# Patient Record
Sex: Male | Born: 1985 | Race: Black or African American | Hispanic: No | Marital: Single | Smoking: Never smoker
Health system: Southern US, Community
[De-identification: ages and names within clinical notes are randomized; demographics above are authoritative.]

---

## 2015-09-13 ENCOUNTER — Encounter: Payer: Self-pay | Admitting: Emergency Medicine

## 2015-09-13 ENCOUNTER — Emergency Department
Admission: EM | Admit: 2015-09-13 | Discharge: 2015-09-13 | Disposition: A | Discharge status: Home / Self Care | Payer: Self-pay | Attending: Emergency Medicine | Admitting: Emergency Medicine

## 2015-09-13 DIAGNOSIS — R112 Nausea with vomiting, unspecified: Secondary | ICD-10-CM | POA: Insufficient documentation

## 2015-09-13 DIAGNOSIS — R1110 Vomiting, unspecified: Secondary | ICD-10-CM

## 2015-09-13 DIAGNOSIS — J020 Streptococcal pharyngitis: Secondary | ICD-10-CM | POA: Insufficient documentation

## 2015-09-13 DIAGNOSIS — R197 Diarrhea, unspecified: Secondary | ICD-10-CM | POA: Insufficient documentation

## 2015-09-13 MED ORDER — AMOXICILLIN 500 MG PO CAPS: 500.0000 mg | ORAL_CAPSULE | Freq: Three times a day (TID) | ORAL | Status: DC

## 2015-09-13 MED ORDER — ONDANSETRON 4 MG PO TBDP
4.0000 mg | ORAL_TABLET | Freq: Once | ORAL | Status: AC
  Administered 2015-09-13: 4 mg via ORAL
  Filled 2015-09-13: qty 1

## 2015-09-13 MED ORDER — ONDANSETRON 4 MG PO TBDP: 4.0000 mg | ORAL_TABLET | Freq: Three times a day (TID) | ORAL | Status: DC | PRN

## 2015-09-13 NOTE — ED Notes (Signed)
Pt states he and his girlfriend are both sick with body aches and chills. They called 911 to be transported to ED after work said they need a doctors note to return to work tomorrow.   Requesting work note.

## 2015-09-13 NOTE — ED Notes (Addendum)
Pt to ed with c/o vomiting and diarrhea x 1 day.  Denies abd pain.  Also reports sore throat and body aches. Pt states he is able to keep gatorade down but no solid food.

## 2015-09-13 NOTE — ED Notes (Signed)
Discussed discharge instructions, prescriptions, and follow-up care with patient. No questions or concerns at this time. Pt stable at discharge.  

## 2015-09-13 NOTE — ED Provider Notes (Signed)
[facility] Emergency Department Provider Note  ____________________________________________  Time seen: Approximately 2:58 PM  I have reviewed the triage vital signs and the nursing notes.   HISTORY  Chief Complaint Diarrhea and Emesis  HPI [facility] is a [age] y.o. male is here with complaint of chills and body aches. Patient states that this began yesterday. His also had some vomiting 2 today and diarrhea 1. He denies any abdominal pain or urinary symptoms. He states he was able to keep down Gatorade but has not been able eat any solid food. He also complains of sore throat. He states he is having nausea now. He states his pain is 6 out of 10 at present.He denies any family members having same thing.   History reviewed. No pertinent past medical history.  There are no active problems to display for this patient.   History reviewed. No pertinent past surgical history.  Current Outpatient Rx  Name  Route  Sig  Dispense  Refill  . amoxicillin (AMOXIL) 500 MG capsule   Oral   Take 1 capsule (500 mg total) by mouth 3 (three) times daily.   30 capsule   0   . ondansetron (ZOFRAN ODT) 4 MG disintegrating tablet   Oral   Take 1 tablet (4 mg total) by mouth every 8 (eight) hours as needed for nausea or vomiting.   20 tablet   0     Allergies Review of patient's allergies indicates no known allergies.  History reviewed. No pertinent family history.  Social History Social History  Substance Use Topics  . Smoking status: Never Smoker   . Smokeless tobacco: None  . Alcohol Use: No    Review of Systems Constitutional: Positive fever/chills Eyes: No visual changes. ENT: Positive sore throat. Cardiovascular: Denies chest pain. Respiratory: Denies shortness of breath. Gastrointestinal: No abdominal pain.  Positive nausea, positive vomiting.  Positive diarrhea.  No constipation. Genitourinary: Negative for dysuria. Musculoskeletal: Negative for  back pain. Skin: Negative for rash. Neurological: Negative for headaches, focal weakness or numbness.  10-point ROS otherwise negative.  ____________________________________________   PHYSICAL EXAM:  VITAL SIGNS: ED Triage Vitals  Enc Vitals Group     BP 09/13/15 1351 131/86 mmHg     Pulse Rate 09/13/15 1351 94     Resp 09/13/15 1351 20     Temp 09/13/15 1351 98.9 F (37.2 C)     Temp Source 09/13/15 1351 Oral     SpO2 09/13/15 1351 96 %     Weight 09/13/15 1351 189 lb (85.73 kg)     Height 09/13/15 1351 6\' 5"  (1.956 m)     Head Cir --      Peak Flow --      Pain Score 09/13/15 1351 6     Pain Loc --      Pain Edu? --      Excl. in GC? --     Constitutional: Alert and oriented. Well appearing and in no acute distress. Eyes: Conjunctivae are normal. PERRL. EOMI. Head: Atraumatic. Nose: No congestion/rhinnorhea. Mouth/Throat: Mucous membranes are moist.  Oropharynx slight erythema with tonsillar exudates more on the right than the left. Neck: No stridor.  Supple Hematological/Lymphatic/Immunilogical: Minimal cervical lymphadenopathy. Cardiovascular: Normal rate, regular rhythm. Grossly normal heart sounds.  Good peripheral circulation. Respiratory: Normal respiratory effort.  No retractions. Lungs CTAB. Gastrointestinal: Soft and nontender. No distention. Bowel sounds normoactive 4 quadrants at this time. Musculoskeletal: No lower extremity tenderness nor edema.  No joint effusions. Neurologic:  Normal speech and language. No gross focal neurologic deficits are appreciated. No gait instability. Skin:  Skin is warm, dry and intact. No rash noted. Psychiatric: Mood and affect are normal. Speech and behavior are normal.  ____________________________________________   LABS (all labs ordered are listed, but only abnormal results are displayed)  Labs Reviewed  CULTURE, GROUP A STREP ([facility] ONLY)    PROCEDURES  Procedure(s) performed: None  Critical Care performed:  No  ____________________________________________   INITIAL IMPRESSION / ASSESSMENT AND PLAN / ED COURSE  Pertinent labs & imaging results that were available during my care of the patient were reviewed by me and considered in my medical decision making (see chart for details).  Patient is given a prescription for Zofran as needed for nausea. He is also given a prescription for amoxicillin 500 mg 3 times a day for 10 days. Patient is aware that he needs to take the complete ten-day course. He is given a note to remain out of work. History of May on clear liquids for the next 24 hours. Tylenol or ibuprofen as needed for fever, throat pain or body aches. ____________________________________________   FINAL CLINICAL IMPRESSION(S) / ED DIAGNOSES  Final diagnoses:  Pharyngitis, streptococcal, acute  Vomiting and diarrhea      Tommi Rumps, PA-C 09/13/15 1623  Tommi Rumps, PA-C 09/13/15 1623  Jene Every, MD 09/13/15 [phone]

## 2015-09-13 NOTE — Discharge Instructions (Signed)
Clear liquids for the next 24 hours. Tylenol or ibuprofen as needed for fever and chills. Amoxicillin for 10 days. Zofran as needed for nausea. Work note was given to remain out of work for the next 24 hours as you are contagious. Follow[facility] with your doctor or [facility] if any continued problems.

## 2015-09-14 LAB — CULTURE, GROUP A STREP (THRC)

## 2015-09-19 LAB — POCT RAPID STREP A
STREPTOCOCCUS, GROUP A SCREEN (DIRECT): POSITIVE — AB
STREPTOCOCCUS, GROUP A SCREEN (DIRECT): POSITIVE — AB

## 2020-12-04 ENCOUNTER — Emergency Department: Payer: No Typology Code available for payment source

## 2020-12-04 ENCOUNTER — Other Ambulatory Visit: Payer: Self-pay

## 2020-12-04 ENCOUNTER — Encounter: Payer: Self-pay | Admitting: Emergency Medicine

## 2020-12-04 ENCOUNTER — Emergency Department
Admission: EM | Admit: 2020-12-04 | Discharge: 2020-12-04 | Disposition: A | Discharge status: Home / Self Care | Payer: No Typology Code available for payment source | Attending: Emergency Medicine | Admitting: Emergency Medicine

## 2020-12-04 DIAGNOSIS — S39012A Strain of muscle, fascia and tendon of lower back, initial encounter: Secondary | ICD-10-CM | POA: Insufficient documentation

## 2020-12-04 DIAGNOSIS — S161XXA Strain of muscle, fascia and tendon at neck level, initial encounter: Secondary | ICD-10-CM | POA: Diagnosis not present

## 2020-12-04 DIAGNOSIS — S3992XA Unspecified injury of lower back, initial encounter: Secondary | ICD-10-CM | POA: Diagnosis present
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
## 2020-12-04 NOTE — Discharge Instructions (Addendum)
It looks like you have pulled the muscles in your neck and back.  The x-rays are negative.  I would try Motrin up to 4 of the over-the-counter pills 3 times a day with food for 3 to 5 days.  You can also use ice or heat while you are awake.  20 minutes at a time.  Do not use either while you are sleeping because you can get burns or frostbite.  Follow-up with your regular doctor or return if you are worse or not any better after about a week.  Your regular doctor may be oh to get to physical therapy if need be.

## 2021-01-21 ENCOUNTER — Other Ambulatory Visit: Payer: Self-pay

## 2021-01-21 ENCOUNTER — Emergency Department
Admission: EM | Admit: 2021-01-21 | Discharge: 2021-01-21 | Disposition: A | Discharge status: Home / Self Care | Payer: No Typology Code available for payment source | Attending: Emergency Medicine | Admitting: Emergency Medicine

## 2021-01-21 DIAGNOSIS — Y92481 Parking lot as the place of occurrence of the external cause: Secondary | ICD-10-CM | POA: Diagnosis not present

## 2021-01-21 DIAGNOSIS — R519 Headache, unspecified: Secondary | ICD-10-CM | POA: Insufficient documentation

## 2021-01-21 DIAGNOSIS — G8929 Other chronic pain: Secondary | ICD-10-CM | POA: Diagnosis not present

## 2021-01-21 DIAGNOSIS — M542 Cervicalgia: Secondary | ICD-10-CM | POA: Diagnosis not present

## 2021-01-21 DIAGNOSIS — M545 Low back pain, unspecified: Secondary | ICD-10-CM | POA: Diagnosis not present

## 2021-01-21 MED ORDER — ACETAMINOPHEN 325 MG PO TABS
650.0000 mg | ORAL_TABLET | Freq: Once | ORAL | Status: AC
  Administered 2021-01-21: 650 mg via ORAL
  Filled 2021-01-21: qty 2

## 2021-01-21 MED ORDER — MELOXICAM 7.5 MG PO TABS
15.0000 mg | ORAL_TABLET | Freq: Once | ORAL | Status: AC
  Administered 2021-01-21: 15 mg via ORAL
  Filled 2021-01-21: qty 2

## 2021-01-21 MED ORDER — MELOXICAM 15 MG PO TABS: 15.0000 mg | ORAL_TABLET | Freq: Every day | ORAL | 0 refills | Status: DC

## 2021-01-21 MED ORDER — METHOCARBAMOL 500 MG PO TABS
750.0000 mg | ORAL_TABLET | Freq: Once | ORAL | Status: AC
  Administered 2021-01-21: 750 mg via ORAL
  Filled 2021-01-21: qty 2

## 2021-01-21 MED ORDER — METHOCARBAMOL 750 MG PO TABS: 750.0000 mg | ORAL_TABLET | Freq: Four times a day (QID) | ORAL | 0 refills | Status: DC | PRN
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
## 2021-01-21 NOTE — Discharge Instructions (Signed)
You may take mobic, 15 mg once daily as prescribed. Please also take the Robaxin, 750 mg 4x daily as prescribed. Please be aware this medication may make you drowsy. Please  use caution when driving or operating heavy machinery. Please also take Tylenol, up to 1000mg  4x daily as needed for pain. Follow up with neurosurgery if not improved in 2 weeks.
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
## 2021-01-30 ENCOUNTER — Encounter: Payer: Self-pay | Admitting: Emergency Medicine

## 2021-01-30 ENCOUNTER — Other Ambulatory Visit: Payer: Self-pay

## 2021-01-30 ENCOUNTER — Emergency Department
Admission: EM | Admit: 2021-01-30 | Discharge: 2021-01-30 | Disposition: A | Discharge status: Home / Self Care | Payer: Self-pay | Attending: Emergency Medicine | Admitting: Emergency Medicine

## 2021-01-30 DIAGNOSIS — S39012D Strain of muscle, fascia and tendon of lower back, subsequent encounter: Secondary | ICD-10-CM | POA: Insufficient documentation

## 2021-01-30 MED ORDER — CYCLOBENZAPRINE HCL 5 MG PO TABS: 5.0000 mg | ORAL_TABLET | Freq: Three times a day (TID) | ORAL | 0 refills | Status: DC | PRN

## 2021-01-30 MED ORDER — LIDOCAINE 5 % EX PTCH: 1.0000 | MEDICATED_PATCH | Freq: Two times a day (BID) | CUTANEOUS | 0 refills | Status: AC | PRN

## 2021-01-30 MED ORDER — NABUMETONE 750 MG PO TABS: 750.0000 mg | ORAL_TABLET | Freq: Two times a day (BID) | ORAL | 0 refills | Status: AC
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
## 2021-02-20 ENCOUNTER — Emergency Department
Admission: EM | Admit: 2021-02-20 | Discharge: 2021-02-20 | Disposition: A | Discharge status: Home / Self Care | Payer: No Typology Code available for payment source | Attending: Emergency Medicine | Admitting: Emergency Medicine

## 2021-02-20 ENCOUNTER — Encounter: Payer: Self-pay | Admitting: Emergency Medicine

## 2021-02-20 ENCOUNTER — Other Ambulatory Visit: Payer: Self-pay

## 2021-02-20 DIAGNOSIS — M545 Low back pain, unspecified: Secondary | ICD-10-CM | POA: Insufficient documentation

## 2021-02-20 MED ORDER — PREDNISONE 10 MG PO TABS: 10.0000 mg | ORAL_TABLET | Freq: Every day | ORAL | 0 refills | Status: AC

## 2021-02-20 MED ORDER — CYCLOBENZAPRINE HCL 5 MG PO TABS: 5.0000 mg | ORAL_TABLET | Freq: Three times a day (TID) | ORAL | 0 refills | Status: AC | PRN
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
## 2021-02-20 NOTE — Discharge Instructions (Signed)
Take Prednisone and Flexeril as directed.
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
